# Patient Record
Sex: Male | Born: 1960 | Race: White | Marital: Married | Smoking: Never smoker
Health system: Southern US, Community
[De-identification: ages and names within clinical notes are randomized; demographics above are authoritative.]

## PROBLEM LIST (undated history)

## (undated) ENCOUNTER — Ambulatory Visit: Source: Home / Self Care

## (undated) HISTORY — PX: SPINE SURGERY: SHX786

---

## 2013-06-05 ENCOUNTER — Other Ambulatory Visit: Payer: Self-pay | Admitting: Family Medicine

## 2013-06-05 ENCOUNTER — Ambulatory Visit
Admission: RE | Admit: 2013-06-05 | Discharge: 2013-06-05 | Disposition: A | Discharge status: Home / Self Care | Payer: BC Managed Care – PPO | Source: Ambulatory Visit | Attending: Family Medicine | Admitting: Family Medicine

## 2013-06-05 DIAGNOSIS — M542 Cervicalgia: Secondary | ICD-10-CM

## 2016-05-05 ENCOUNTER — Ambulatory Visit (INDEPENDENT_AMBULATORY_CARE_PROVIDER_SITE_OTHER): Payer: BLUE CROSS/BLUE SHIELD | Admitting: Physician Assistant

## 2016-05-05 VITALS — BP 109/74 | HR 81 | Temp 98.6°F | Resp 18 | Ht 68.5 in | Wt 205.0 lb

## 2016-05-05 DIAGNOSIS — L02413 Cutaneous abscess of right upper limb: Secondary | ICD-10-CM | POA: Diagnosis not present

## 2016-05-05 DIAGNOSIS — L03113 Cellulitis of right upper limb: Secondary | ICD-10-CM | POA: Diagnosis not present

## 2016-05-05 LAB — POCT CBC
Granulocyte percent: 62.5 %G (ref 37–80)
HCT, POC: 37.3 % — AB (ref 43.5–53.7)
Hemoglobin: 13.2 g/dL — AB (ref 14.1–18.1)
Lymph, poc: 3.6 — AB (ref 0.6–3.4)
MCH, POC: 32.1 pg — AB (ref 27–31.2)
MCHC: 35.3 g/dL (ref 31.8–35.4)
MCV: 91 fL (ref 80–97)
MID (cbc): 0.2 (ref 0–0.9)
MPV: 6.4 fL (ref 0–99.8)
POC Granulocyte: 6.4 (ref 2–6.9)
POC LYMPH %: 35.1 % (ref 10–50)
POC MID %: 2.4 %M (ref 0–12)
Platelet Count, POC: 490 10*3/uL — AB (ref 142–424)
RBC: 4.1 M/uL — AB (ref 4.69–6.13)
RDW, POC: 11.9 %
WBC: 10.3 10*3/uL — AB (ref 4.6–10.2)

## 2016-05-05 MED ORDER — DOXYCYCLINE HYCLATE 100 MG PO CAPS: 100.0000 mg | ORAL_CAPSULE | Freq: Two times a day (BID) | ORAL | 0 refills | Status: AC

## 2016-05-05 NOTE — Progress Notes (Signed)
05/06/2016 8:42 AM   DOB: 1960/12/15 / MRN: [id number]  SUBJECTIVE:  Aaron Villa is a 56 y.o. male presenting for an arm abscess that started about a week ago.  Notes that it started as a pimple, and he tried to "pop it" about 3 days ago and the lesion became worse. He feels well today aside from some mild pain about the lesion. He denies fever, chills, nausea, and dizziness.   He has No Known Allergies.   He  has no past medical history on file.    He  reports that he has never smoked. He has never used smokeless tobacco. He reports that he does not drink alcohol or use drugs. He  has no sexual activity history on file. The patient  has a past surgical history that includes Spine surgery.  His family history includes Cancer in his father.  Review of Systems  Constitutional: Negative for chills, diaphoresis and fever.  Gastrointestinal: Negative for nausea.  Skin: Positive for rash.  Neurological: Negative for dizziness.    The problem list and medications were reviewed and updated by myself where necessary and exist elsewhere in the encounter.   OBJECTIVE:  BP 109/74   Pulse 81   Temp 98.6 F (37 C) (Oral)   Resp 18   Ht 5' 8.5" (1.74 m)   Wt 205 lb (93 kg)   SpO2 95%   BMI 30.72 kg/m   Physical Exam  Constitutional: He appears well-developed. He is active and cooperative.  Non-toxic appearance.  Cardiovascular: Normal rate.   Pulmonary/Chest: Effort normal. No tachypnea.  Neurological: He is alert.  Skin: Skin is warm and dry. Rash (mildly tender purulent furuncle with surrounding erythema and tenderness) noted. He is not diaphoretic. No pallor.  Vitals reviewed.   a    Results for orders placed or performed in visit on 05/05/16 (from the past 72 hour(s))  POCT CBC     Status: Abnormal   Collection Time: 05/05/16  7:04 PM  Result Value Ref Range   WBC 10.3 (A) 4.6 - 10.2 K/uL   Lymph, poc 3.6 (A) 0.6 - 3.4   POC LYMPH PERCENT 35.1 10 - 50 %L   MID (cbc) 0.2  0 - 0.9   POC MID % 2.4 0 - 12 %M   POC Granulocyte 6.4 2 - 6.9   Granulocyte percent 62.5 37 - 80 %G   RBC 4.10 (A) 4.69 - 6.13 M/uL   Hemoglobin 13.2 (A) 14.1 - 18.1 g/dL   HCT, POC 40.9 (A) 81.1 - 53.7 %   MCV 91.0 80 - 97 fL   MCH, POC 32.1 (A) 27 - 31.2 pg   MCHC 35.3 31.8 - 35.4 g/dL   RDW, POC 91.4 %   Platelet Count, POC 490 (A) 142 - 424 K/uL   MPV 6.4 0 - 99.8 fL    No results found.  ASSESSMENT AND PLAN:  Yordi was seen today for r arm cyst.  Diagnoses and all orders for this visit:  Abscess of right upper extremity Comments: Appears to be MRSA on inspection. Will start doxy today. Culture pending.  I hope that this is early enough to prevent the need for admission.  RTC precautions discussed. CBC drawn today for comparison purposes should he not fare well.  Orders: -     POCT CBC -     WOUND CULTURE       -     doxycycline (VIBRAMYCIN) 100 MG capsule; Take 1 capsule (100  mg total) by            mouth 2 (two) times daily.  Cellulitis of right upper extremity Comments: Early. He will come back tomorrow if fever, chills, nausea, increasing pain.        The patient is advised to call or return to clinic if he does not see an improvement in symptoms, or to seek the care of the closest emergency department if he worsens with the above plan.   Deliah Boston, MHS, PA-C [facility] 05/06/2016 8:42 AM

## 2016-05-05 NOTE — Patient Instructions (Addendum)
Please use a warm compress on the arm as often as possible.  Please take the antibiotic as prescribed.  Come back first thing Monday so we can look at the wound again and repack if necessary.    If you are experiencing fever, chills, nausea, or worsening arm pain come back tomorrow.

## 2016-05-07 LAB — WOUND CULTURE

## 2016-05-11 ENCOUNTER — Telehealth: Payer: Self-pay | Admitting: Physician Assistant

## 2016-05-11 NOTE — Telephone Encounter (Signed)
PATIENT'S WIFE (MICHELLE) CALLED TO GET HER HUSBAND'S CULTURE RESULTS OF HIS ABSCESS THAT MICHAEL CLARK DID ON Friday (05/05/16). SHE SAID THEY ARE EXPECTING OUT OF TOWN GUEST AND SHE WOULD LIKE TO CALL THEM BEFORE THEY GET ON THE ROAD TO NOT COME IF HE HAS MRSA. SHE WOULD LIKE A CALL BACK AS SOON AS POSSIBLE.  BEST PHONE [phone] (WIFE'S NAME IS MICHELLE AND SHE IS ON HIS 2018 HIPAA) PHARMACY CHOICE IS [facility] ON [address]. MBC

## 2016-05-11 NOTE — Telephone Encounter (Signed)
Wound culture results given by Dr. Alwyn Ren Pt advised to continue prescribed antibiotics and call clinic if no response Verbalized understanding

## 2016-08-24 ENCOUNTER — Other Ambulatory Visit: Payer: Self-pay | Admitting: Physical Medicine and Rehabilitation

## 2016-08-24 DIAGNOSIS — M542 Cervicalgia: Secondary | ICD-10-CM

## 2016-08-25 ENCOUNTER — Ambulatory Visit
Admission: RE | Admit: 2016-08-25 | Discharge: 2016-08-25 | Disposition: A | Discharge status: Home / Self Care | Payer: BLUE CROSS/BLUE SHIELD | Source: Ambulatory Visit | Attending: Physical Medicine and Rehabilitation | Admitting: Physical Medicine and Rehabilitation

## 2016-08-25 DIAGNOSIS — M542 Cervicalgia: Secondary | ICD-10-CM

## 2016-08-25 IMAGING — CT CT CERVICAL SPINE W/ CM
2 series · 10 of 14 positions shown, 12 images · IV contrast (iopamidol)
Comparison: Plain films [DATE].

CLINICAL DATA: Neck pain.  Decreased range of motion.  Recent fall.

EXAM:
CT CERVICAL SPINE WITH CONTRAST
TECHNIQUE: Multidetector CT imaging of the cervical spine was performed during
intravenous contrast administration. Multiplanar CT image
reconstructions were also generated.
CONTRAST:  80mL [GD] IOPAMIDOL ([GD]) INJECTION 61%

[Series 3: cspine soft · axial · 0.29mm/px · z∈[-222,-96]mm · 5 of 95 slices shown]
[im 16/95  soft-tissue]
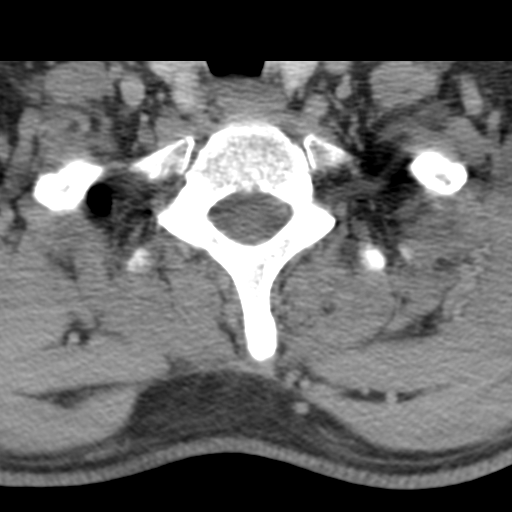
[im 32/95  soft-tissue]
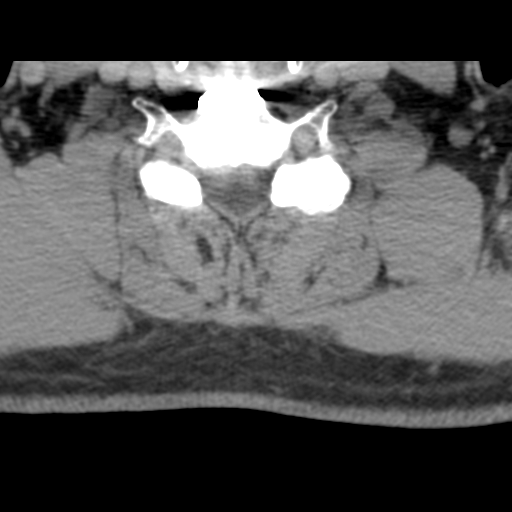
[im 48/95  soft-tissue]
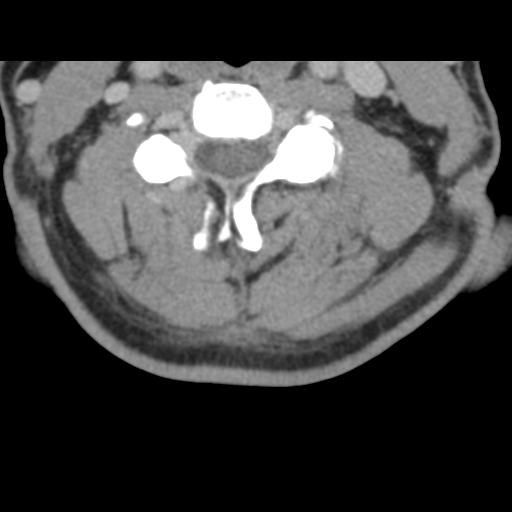
[im 63/95  soft-tissue]
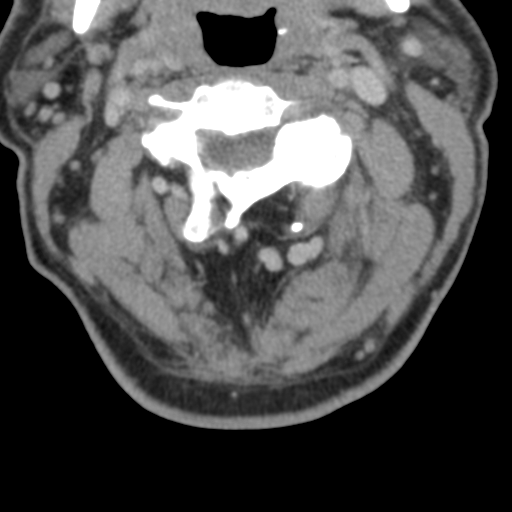
[im 79/95  soft-tissue]
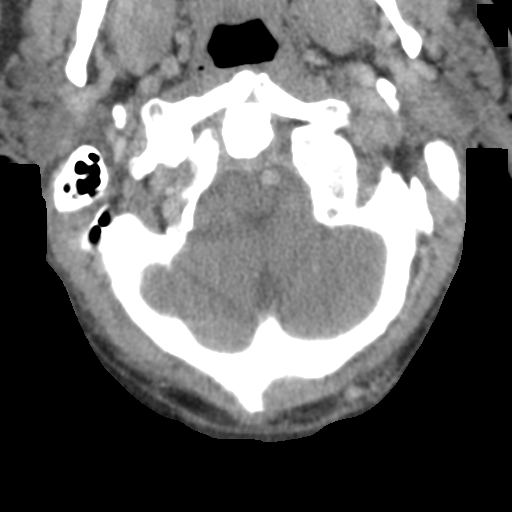

[Series 9: angled axial · axial · 0.26mm/px · z∈[-231,-108]mm · 5 of 94 slices shown, 7 images]
[im 16/94  soft-tissue]
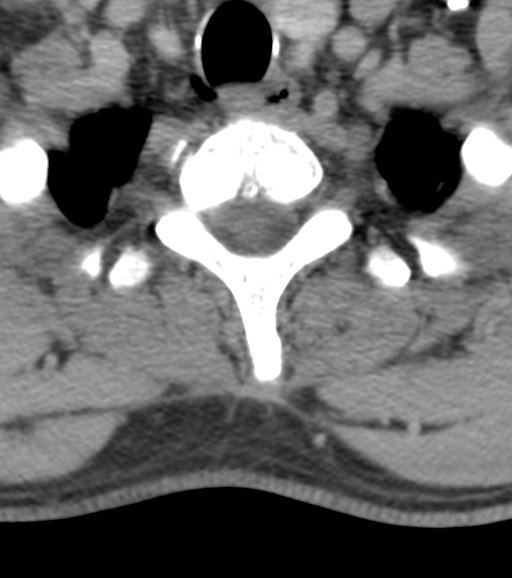
[im 16/94  bone]
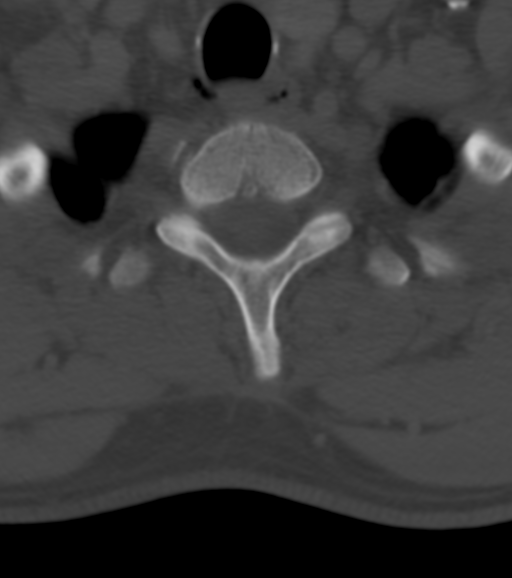
[im 32/94  bone]
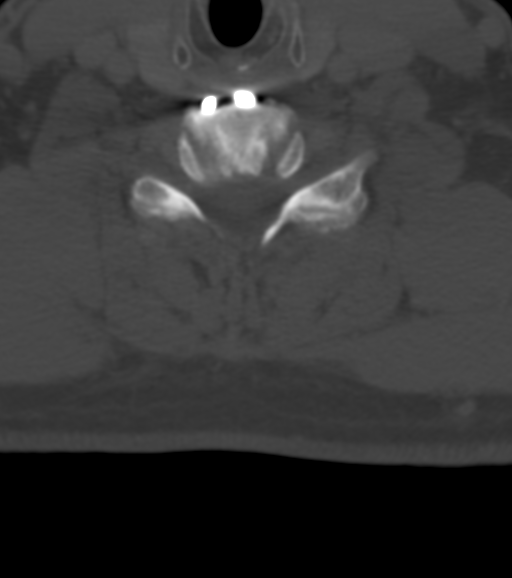
[im 47/94  bone]
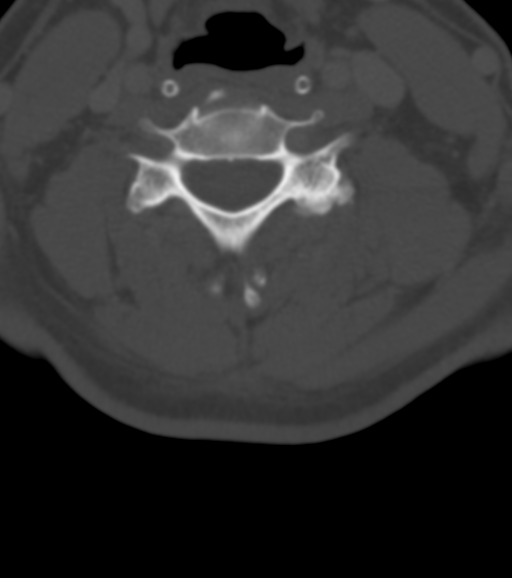
[im 63/94  bone]
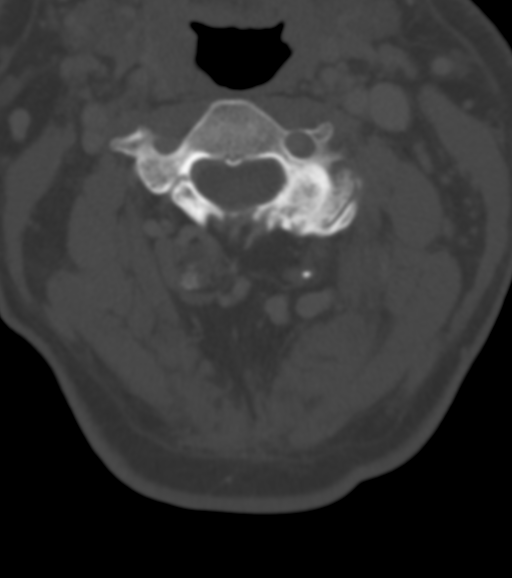
[im 78/94  soft-tissue]
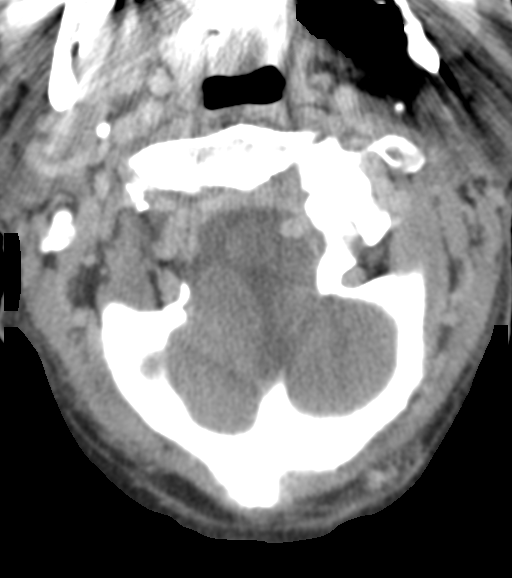
[im 78/94  bone]
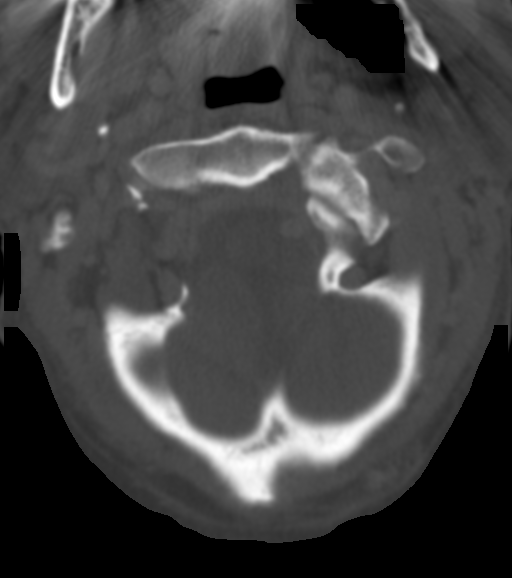

[10 of 14 positions shown; findings below may reference images not displayed]

FINDINGS: Alignment: Anatomic

Skull base and vertebrae: No worrisome osseous lesion.

Soft tissues and spinal canal: No prevertebral fluid or swelling. No
visible canal hematoma.

Disc levels:

C1-C2: Moderate degenerative change. Incomplete ossification across
the anterior arch of C1. No fracture.

C2-3: Calcified central protrusion. Severe LEFT and mild RIGHT facet
arthropathy. No definite C3 foraminal narrowing.

C3-4: Annular bulge. Severe LEFT and mild RIGHT facet arthropathy.
Slight LEFT-sided uncinate spurring. Mild LEFT C4 foraminal
narrowing.

C4-5: Annular bulge. Moderate LEFT and mild RIGHT facet arthropathy.
No definite impingement.

C5-6: Status post ACDF. Solid interbody arthrodesis. There is bony
bridging across the facet complex on the LEFT. There is no definite
residual impingement.

C6-7: Pseudarthrosis. Lucency through the interspace. No definite
hardware fracture or loosening. No posterior arthrodesis across the
facets. Osseous spurring. No definite impingement.

C7-T1: BILATERAL facet arthropathy of a moderate to severe nature.
No subluxation. No definite impingement.

Upper chest: Azygos lobe.  No pneumothorax or nodule.

Other: None.
IMPRESSION: The dominant finding is C6-7 pseudarthrosis; no solid interbody or
posterior arthrodesis. Status post C5-C7 ACDF; C5-6 appears solid.

Severe LEFT-sided facet arthropathy at C2-3, and C3-4. Mild LEFT C4
foraminal narrowing.

No posttraumatic sequelae are evident.

## 2016-08-25 MED ORDER — IOPAMIDOL (ISOVUE-300) INJECTION 61%
80.0000 mL | Freq: Once | INTRAVENOUS | Status: AC | PRN
  Administered 2016-08-25: 80 mL via INTRAVENOUS

## 2016-09-01 ENCOUNTER — Other Ambulatory Visit: Payer: Self-pay

## 2016-10-13 ENCOUNTER — Ambulatory Visit: Payer: BLUE CROSS/BLUE SHIELD | Admitting: Neurology

## 2016-12-08 ENCOUNTER — Ambulatory Visit: Payer: BLUE CROSS/BLUE SHIELD | Admitting: Neurology

## 2017-01-19 ENCOUNTER — Ambulatory Visit: Payer: BLUE CROSS/BLUE SHIELD | Admitting: Neurology

## 2018-12-15 ENCOUNTER — Other Ambulatory Visit: Payer: Self-pay

## 2018-12-15 ENCOUNTER — Encounter (HOSPITAL_COMMUNITY): Payer: Self-pay | Admitting: Emergency Medicine

## 2018-12-15 ENCOUNTER — Ambulatory Visit (HOSPITAL_COMMUNITY)
Admission: EM | Admit: 2018-12-15 | Discharge: 2018-12-15 | Disposition: A | Discharge status: Home / Self Care | Payer: BC Managed Care – PPO | Attending: Emergency Medicine | Admitting: Emergency Medicine

## 2018-12-15 DIAGNOSIS — R05 Cough: Secondary | ICD-10-CM | POA: Diagnosis not present

## 2018-12-15 DIAGNOSIS — R0981 Nasal congestion: Secondary | ICD-10-CM | POA: Insufficient documentation

## 2018-12-15 DIAGNOSIS — R059 Cough, unspecified: Secondary | ICD-10-CM

## 2018-12-15 DIAGNOSIS — U071 COVID-19: Secondary | ICD-10-CM | POA: Insufficient documentation

## 2018-12-15 MED ORDER — FLUTICASONE PROPIONATE 50 MCG/ACT NA SUSP: 2.0000 | Freq: Every day | NASAL | 0 refills | Status: AC

## 2018-12-15 MED ORDER — IPRATROPIUM BROMIDE 0.06 % NA SOLN: 2.0000 | Freq: Four times a day (QID) | NASAL | 0 refills | Status: AC

## 2018-12-15 NOTE — Discharge Instructions (Signed)
COVID testing ordered. I would like you to quarantine until testing results. You can use flonase/atrovent as directed for nasal congestion. If experiencing shortness of breath, trouble breathing, go to the emergency department for further evaluation needed.

## 2018-12-15 NOTE — ED Provider Notes (Signed)
[facility]    CSN: [id number] Arrival date & time: 12/15/18  1641      History   Chief Complaint Chief Complaint  Patient presents with  . Cough    HPI Aaron Villa is a 58 y.o. male.   58 year old male comes in for 1 week history of positive Covid exposure, URI symptoms.  States 3 days ago, had a temp of 99.  This started improving, but now with cough and congestion.  Denies fever, chills, body aches.  Denies abdominal pain, nausea, vomiting, diarrhea.  No shortness of breath, loss of taste or smell.  No tried anything for the symptoms.     History reviewed. No pertinent past medical history.  There are no active problems to display for this patient.   Past Surgical History:  Procedure Laterality Date  . SPINE SURGERY         Home Medications    Prior to Admission medications   Medication Sig Start Date End Date Taking? Authorizing Provider  fluticasone (FLONASE) 50 MCG/ACT nasal spray Place 2 sprays into both nostrils daily. 12/15/18   Cathie Hoops, Glennis Montenegro V, PA-C  ipratropium (ATROVENT) 0.06 % nasal spray Place 2 sprays into both nostrils 4 (four) times daily. 12/15/18   Belinda Fisher, PA-C    Family History Family History  Problem Relation Age of Onset  . Cancer Father     Social History Social History   Tobacco Use  . Smoking status: Never Smoker  . Smokeless tobacco: Never Used  Substance Use Topics  . Alcohol use: No  . Drug use: No     Allergies   Patient has no known allergies.   Review of Systems Review of Systems  Reason unable to perform ROS: See HPI as above.     Physical Exam Triage Vital Signs ED Triage Vitals  Enc Vitals Group     BP 12/15/18 1747 118/80     Pulse Rate 12/15/18 1747 82     Resp 12/15/18 1747 16     Temp 12/15/18 1747 98.5 F (36.9 C)     Temp src --      SpO2 12/15/18 1747 100 %     Weight --      Height --      Head Circumference --      Peak Flow --      Pain Score 12/15/18 1748 0     Pain Loc --       Pain Edu? --      Excl. in GC? --    No data found.  Updated Vital Signs BP 118/80   Pulse 82   Temp 98.5 F (36.9 C)   Resp 16   SpO2 100%   Physical Exam Constitutional:      General: He is not in acute distress.    Appearance: Normal appearance. He is not ill-appearing, toxic-appearing or diaphoretic.  HENT:     Head: Normocephalic and atraumatic.     Mouth/Throat:     Mouth: Mucous membranes are moist.     Pharynx: Oropharynx is clear. Uvula midline.  Neck:     Musculoskeletal: Normal range of motion and neck supple.  Cardiovascular:     Rate and Rhythm: Normal rate and regular rhythm.     Heart sounds: Normal heart sounds. No murmur. No friction rub. No gallop.   Pulmonary:     Effort: Pulmonary effort is normal. No accessory muscle usage, prolonged expiration, respiratory distress or retractions.  Comments: Lungs clear to auscultation without adventitious lung sounds. Neurological:     General: No focal deficit present.     Mental Status: He is alert and oriented to person, place, and time.      UC Treatments / Results  Labs (all labs ordered are listed, but only abnormal results are displayed) Labs Reviewed  NOVEL CORONAVIRUS, NAA (HOSP ORDER, SEND-OUT TO REF LAB; TAT 18-24 HRS)    EKG   Radiology No results found.  Procedures Procedures (including critical care time)  Medications Ordered in UC Medications - No data to display  Initial Impression / Assessment and Plan / UC Course  I have reviewed the triage vital signs and the nursing notes.  Pertinent labs & imaging results that were available during my care of the patient were reviewed by me and considered in my medical decision making (see chart for details).     COVID test ordered. Patient to quarantine until testing results return. No alarming signs on exam.  Patient speaking in full sentences without respiratory distress.  Symptomatic treatment discussed.  Push fluids.  Return  precautions given.  Patient expresses understanding and agrees to plan.  Final Clinical Impressions(s) / UC Diagnoses   Final diagnoses:  Cough  Nasal congestion    ED Prescriptions    Medication Sig Dispense Auth. Provider   ipratropium (ATROVENT) 0.06 % nasal spray Place 2 sprays into both nostrils 4 (four) times daily. 15 mL Greenleigh Kauth V, PA-C   fluticasone (FLONASE) 50 MCG/ACT nasal spray Place 2 sprays into both nostrils daily. 1 g Ok Edwards, PA-C     PDMP not reviewed this encounter.   Ok Edwards, PA-C 12/15/18 1811

## 2018-12-15 NOTE — ED Triage Notes (Signed)
Pt states he had a fever Friday, he felt better, then today he woke up congested. With cough.

## 2018-12-16 ENCOUNTER — Telehealth (HOSPITAL_COMMUNITY): Payer: Self-pay | Admitting: Emergency Medicine

## 2018-12-16 LAB — NOVEL CORONAVIRUS, NAA (HOSP ORDER, SEND-OUT TO REF LAB; TAT 18-24 HRS): SARS-CoV-2, NAA: DETECTED — AB

## 2018-12-16 NOTE — Telephone Encounter (Signed)
Positive covid. Contacted patient and made him aware. Pt denies symptoms at this time, quarantine ends 12/22/2018. All questions answered.

## 2018-12-21 ENCOUNTER — Ambulatory Visit (HOSPITAL_COMMUNITY)
Admission: EM | Admit: 2018-12-21 | Discharge: 2018-12-21 | Discharge status: Against Medical Advice | Payer: BC Managed Care – PPO

## 2018-12-21 ENCOUNTER — Ambulatory Visit (INDEPENDENT_AMBULATORY_CARE_PROVIDER_SITE_OTHER)
Admission: RE | Admit: 2018-12-21 | Discharge: 2018-12-21 | Disposition: A | Discharge status: Other Acute Inpatient Hospital | Payer: BC Managed Care – PPO | Source: Ambulatory Visit

## 2018-12-21 ENCOUNTER — Other Ambulatory Visit: Payer: Self-pay

## 2018-12-21 DIAGNOSIS — R5381 Other malaise: Secondary | ICD-10-CM

## 2018-12-21 DIAGNOSIS — U071 COVID-19: Secondary | ICD-10-CM

## 2018-12-21 NOTE — ED Provider Notes (Signed)
Virtual Visit via Video Note:  Aaron Villa  initiated request for Telemedicine visit with [facility] team. I connected with Richelle Ito  on 12/21/2018 at 10:32 AM  for a synchronized telemedicine visit using a video enabled HIPPA compliant telemedicine application. I verified that I am speaking with Richelle Ito  using two identifiers. Jaynee Eagles, PA-C  was physically located in a [facility] Urgent care site and Bladimir Auman was located at a different location.   The limitations of evaluation and management by telemedicine as well as the availability of in-person appointments were discussed. Patient was informed that he  may incur a bill ( including co-pay) for this virtual visit encounter. Tommie Raymond Caulfield  expressed understanding and gave verbal consent to proceed with virtual visit.    History of Present Illness:Aaron Villa  is a 58 y.o. male presents with 11 day history of persistent malaise with positive COVID infection. He was prescribed Atrovent, Flonase. Still has low grade fever, body aches. Two of his friends that had COVID got Z-pack and are doing better now. He would like to get this now.     ROS Denies chest pain, shob.    No current facility-administered medications for this encounter.    Current Outpatient Medications  Medication Sig Dispense Refill  . fluticasone (FLONASE) 50 MCG/ACT nasal spray Place 2 sprays into both nostrils daily. 1 g 0  . ipratropium (ATROVENT) 0.06 % nasal spray Place 2 sprays into both nostrils 4 (four) times daily. 15 mL 0     No Known Allergies    PMH is negative.    Past Surgical History:  Procedure Laterality Date  . SPINE SURGERY       Observations/Objective: Physical Exam Constitutional:      General: He is not in acute distress.    Appearance: Normal appearance. He is not ill-appearing, toxic-appearing or diaphoretic.  HENT:     Head: Atraumatic.  Eyes:     General:        Right eye: No discharge.         Left eye: No discharge.     Extraocular Movements: Extraocular movements intact.  Pulmonary:     Effort: Pulmonary effort is normal.  Neurological:     Mental Status: He is alert and oriented to person, place, and time.  Psychiatric:        Mood and Affect: Mood normal.        Behavior: Behavior normal.        Thought Content: Thought content normal.        Judgment: Judgment normal.      Assessment and Plan:  1. COVID-19 virus infection   2. Malaise    Counseled patient against using azithromycin as COVID-19 is a viral infection.  Patient still had significant concern since his friends were able to get a Z-Pak without coming in.  Recommended patient come on into the clinic so that we could examine him and determine if it would be appropriate for him to get an antibiotic course for viral infection.  Patient was agreeable to setting up an appointment to come on into the [facility] clinic.   Follow Up Instructions:    I discussed the assessment and treatment plan with the patient. The patient was provided an opportunity to ask questions and all were answered. The patient agreed with the plan and demonstrated an understanding of the instructions.   The patient was advised to call back or seek an in-person evaluation if  the symptoms worsen or if the condition fails to improve as anticipated.  I provided 15 minutes of non-face-to-face time during this encounter.    Wallis Bamberg, PA-C  12/21/2018 10:32 AM      Wallis Bamberg, PA-C 12/21/18 1045

## 2018-12-21 NOTE — ED Notes (Signed)
Per registration, pt has been called to register multiple times without any response over past 40 min.

## 2020-09-05 ENCOUNTER — Ambulatory Visit (HOSPITAL_COMMUNITY)
Admission: EM | Admit: 2020-09-05 | Discharge: 2020-09-05 | Disposition: A | Discharge status: Home / Self Care | Payer: BLUE CROSS/BLUE SHIELD | Attending: Physician Assistant | Admitting: Physician Assistant

## 2020-09-05 ENCOUNTER — Encounter (HOSPITAL_COMMUNITY): Payer: Self-pay

## 2020-09-05 ENCOUNTER — Other Ambulatory Visit: Payer: Self-pay

## 2020-09-05 DIAGNOSIS — U071 COVID-19: Secondary | ICD-10-CM | POA: Diagnosis not present

## 2020-09-05 DIAGNOSIS — J069 Acute upper respiratory infection, unspecified: Secondary | ICD-10-CM | POA: Insufficient documentation

## 2020-09-05 NOTE — ED Triage Notes (Signed)
Pt present fever, body aches, and chills. Symptom started today. Pt took an at home covid test and it was positive.

## 2020-09-05 NOTE — Discharge Instructions (Addendum)
Your covid test is pending 

## 2020-09-05 NOTE — ED Provider Notes (Signed)
[facility]    CSN: [id number] Arrival date & time: 09/05/20  1649      History   Chief Complaint Chief Complaint  Patient presents with   Fever   Generalized Body Aches   Chills    HPI Aaron Villa is a 60 y.o. male.   Pt had a positive home covid test.  Pt needs covid test for employer.    The history is provided by the patient. No language interpreter was used.  Cough Cough characteristics:  Non-productive Sputum characteristics:  Nondescript Severity:  Mild Onset quality:  Sudden Duration:  1 week Timing:  Constant Progression:  Worsening Relieved by:  Nothing Worsened by:  Nothing Ineffective treatments:  None tried Associated symptoms: fever and myalgias   Risk factors: no recent infection    History reviewed. No pertinent past medical history.  There are no problems to display for this patient.   Past Surgical History:  Procedure Laterality Date   SPINE SURGERY         Home Medications    Prior to Admission medications   Medication Sig Start Date End Date Taking? Authorizing Provider  fluticasone (FLONASE) 50 MCG/ACT nasal spray Place 2 sprays into both nostrils daily. 12/15/18   Cathie Hoops, Amy V, PA-C  ipratropium (ATROVENT) 0.06 % nasal spray Place 2 sprays into both nostrils 4 (four) times daily. 12/15/18   Belinda Fisher, PA-C    Family History Family History  Problem Relation Age of Onset   Cancer Father     Social History Social History   Tobacco Use   Smoking status: Never   Smokeless tobacco: Never  Substance Use Topics   Alcohol use: No   Drug use: No     Allergies   Patient has no known allergies.   Review of Systems Review of Systems  Constitutional:  Positive for fever.  Respiratory:  Positive for cough.   Musculoskeletal:  Positive for myalgias.  All other systems reviewed and are negative.   Physical Exam Triage Vital Signs ED Triage Vitals  Enc Vitals Group     BP 09/05/20 1746 (!) 120/103     Pulse  Rate 09/05/20 1746 (!) 107     Resp 09/05/20 1746 18     Temp 09/05/20 1746 99.4 F (37.4 C)     Temp Source 09/05/20 1746 Oral     SpO2 09/05/20 1746 97 %     Weight --      Height --      Head Circumference --      Peak Flow --      Pain Score 09/05/20 1747 4     Pain Loc --      Pain Edu? --      Excl. in GC? --    No data found.  Updated Vital Signs BP (!) 120/103 (BP Location: Right Arm)   Pulse (!) 107   Temp 99.4 F (37.4 C) (Oral)   Resp 18   SpO2 97%   Visual Acuity Right Eye Distance:   Left Eye Distance:   Bilateral Distance:    Right Eye Near:   Left Eye Near:    Bilateral Near:     Physical Exam Vitals and nursing note reviewed.  Constitutional:      Appearance: He is well-developed.  HENT:     Head: Normocephalic.  Cardiovascular:     Rate and Rhythm: Normal rate.  Pulmonary:     Effort: Pulmonary effort is normal.  Abdominal:  General: There is no distension.  Musculoskeletal:        General: Normal range of motion.     Cervical back: Normal range of motion.  Skin:    General: Skin is warm.  Neurological:     General: No focal deficit present.     Mental Status: He is alert and oriented to person, place, and time.  Psychiatric:        Mood and Affect: Mood normal.     UC Treatments / Results  Labs (all labs ordered are listed, but only abnormal results are displayed) Labs Reviewed - No data to display  EKG   Radiology No results found.  Procedures Procedures (including critical care time)  Medications Ordered in UC Medications - No data to display  Initial Impression / Assessment and Plan / UC Course  I have reviewed the triage vital signs and the nursing notes.  Pertinent labs & imaging results that were available during my care of the patient were reviewed by me and considered in my medical decision making (see chart for details).     MDM:  Covid pending.  Pt declined paxlovid.   Final Clinical Impressions(s) / UC  Diagnoses   Final diagnoses:  Viral upper respiratory tract infection     Discharge Instructions      Your covid test is pending    ED Prescriptions   None    PDMP not reviewed this encounter. An After Visit Summary was printed and given to the patient.    Elson Areas, [place] 09/05/20 1824

## 2020-09-06 LAB — SARS CORONAVIRUS 2 (TAT 6-24 HRS): SARS Coronavirus 2: POSITIVE — AB
